# Patient Record
Sex: Male | Born: 1965 | Race: Black or African American | Hispanic: No | Marital: Married | Smoking: Current every day smoker
Health system: Southern US, Community
[De-identification: ages and names within clinical notes are randomized; demographics above are authoritative.]

---

## 2014-09-22 ENCOUNTER — Emergency Department (HOSPITAL_BASED_OUTPATIENT_CLINIC_OR_DEPARTMENT_OTHER)
Admission: EM | Admit: 2014-09-22 | Discharge: 2014-09-23 | Disposition: A | Discharge status: Home / Self Care | Payer: Self-pay | Attending: Emergency Medicine | Admitting: Emergency Medicine

## 2014-09-22 ENCOUNTER — Emergency Department (HOSPITAL_BASED_OUTPATIENT_CLINIC_OR_DEPARTMENT_OTHER): Payer: Self-pay

## 2014-09-22 ENCOUNTER — Encounter (HOSPITAL_BASED_OUTPATIENT_CLINIC_OR_DEPARTMENT_OTHER): Payer: Self-pay | Admitting: *Deleted

## 2014-09-22 DIAGNOSIS — R319 Hematuria, unspecified: Secondary | ICD-10-CM | POA: Insufficient documentation

## 2014-09-22 DIAGNOSIS — N3289 Other specified disorders of bladder: Secondary | ICD-10-CM

## 2014-09-22 DIAGNOSIS — R52 Pain, unspecified: Secondary | ICD-10-CM

## 2014-09-22 DIAGNOSIS — Z720 Tobacco use: Secondary | ICD-10-CM | POA: Insufficient documentation

## 2014-09-22 DIAGNOSIS — N329 Bladder disorder, unspecified: Secondary | ICD-10-CM | POA: Insufficient documentation

## 2014-09-22 DIAGNOSIS — R3129 Other microscopic hematuria: Secondary | ICD-10-CM

## 2014-09-22 DIAGNOSIS — K5909 Other constipation: Secondary | ICD-10-CM | POA: Insufficient documentation

## 2014-09-22 LAB — URINE MICROSCOPIC-ADD ON

## 2014-09-22 LAB — URINALYSIS, ROUTINE W REFLEX MICROSCOPIC
Bilirubin Urine: NEGATIVE
Glucose, UA: NEGATIVE mg/dL
Ketones, ur: 15 mg/dL — AB
Leukocytes, UA: NEGATIVE
NITRITE: NEGATIVE
PH: 5.5 (ref 5.0–8.0)
Protein, ur: NEGATIVE mg/dL
SPECIFIC GRAVITY, URINE: 1.027 (ref 1.005–1.030)
Urobilinogen, UA: 1 mg/dL (ref 0.0–1.0)

## 2014-09-22 MED ORDER — KETOROLAC TROMETHAMINE 30 MG/ML IJ SOLN
60.0000 mg | Freq: Once | INTRAMUSCULAR | Status: DC
  Filled 2014-09-22: qty 2

## 2014-09-22 MED ORDER — KETOROLAC TROMETHAMINE 60 MG/2ML IM SOLN
60.0000 mg | Freq: Once | INTRAMUSCULAR | Status: AC
  Administered 2014-09-22: 60 mg via INTRAMUSCULAR
  Filled 2014-09-22: qty 2

## 2014-09-22 NOTE — ED Provider Notes (Signed)
CSN: [id number]     Arrival date & time 09/22/14  2224 History   This chart was scribed for Avi Kerschner Smitty CordsK Yi Falletta-Rasch, MD by Freida Busmaniana Omoyeni, ED Scribe. This patient was seen in room MH12/MH12 and the patient's care was started 11:13 PM.  Chief Complaint  Patient presents with  . Abdominal Pain      Patient is a 48 y.o. male presenting with abdominal pain. The history is provided by the patient. No language interpreter was used.  Abdominal Pain Pain location:  Suprapubic Pain radiates to:  Does not radiate Pain severity:  Severe Onset quality:  Gradual Duration:  4 weeks Timing:  Intermittent Progression:  Resolved Chronicity:  Recurrent Context: not trauma   Relieved by:  Nothing Worsened by:  Nothing tried Ineffective treatments:  None tried Associated symptoms: no constipation, no fever, no nausea and no vomiting   Risk factors: no recent hospitalization    HPI Comments:  David Ruiz is a 48 y.o. male who presents to the Emergency Department complaining of intermittent lower abdominal pain that started about a month ago. He notes pain often resolves on its own but today's episode was longer than usual. He denies nausea, vomiting, constipation and fever. His last normal bowel movement was today. Pt notes he was advised by a doctor friend that he may have a hernia.  No alleviating factors noted.    History reviewed. No pertinent past medical history. History reviewed. No pertinent past surgical history. History reviewed. No pertinent family history. History  Substance Use Topics  . Smoking status: Current Every Day Smoker -- 0.50 packs/day    Types: Cigarettes  . Smokeless tobacco: Not on file  . Alcohol Use: No    Review of Systems  Constitutional: Negative for fever.  Gastrointestinal: Positive for abdominal pain. Negative for nausea, vomiting and constipation.  All other systems reviewed and are negative.     Allergies  Review of patient's allergies indicates no  known allergies.  Home Medications   Prior to Admission medications   Not on File   BP 161/100 mmHg  Pulse 89  Temp(Src) 98.6 F (37 C)  Resp 16  Ht 5\' 9"  (1.753 m)  Wt 160 lb (72.576 kg)  BMI 23.62 kg/m2  SpO2 100% Physical Exam  Constitutional: He is oriented to person, place, and time. He appears well-developed and well-nourished. No distress.  HENT:  Head: Normocephalic and atraumatic.  Mouth/Throat: Oropharynx is clear and moist. No oropharyngeal exudate.  Eyes: Conjunctivae are normal. Pupils are equal, round, and reactive to light.  Neck: Normal range of motion. Neck supple.  Cardiovascular: Normal rate, regular rhythm and normal heart sounds.   Pulmonary/Chest: Effort normal and breath sounds normal. No respiratory distress.  Abdominal: Soft. He exhibits no distension and no mass. There is no tenderness. There is no rebound and no guarding.  Hyperactive bowel sounds throughout. No hernia  Genitourinary:  No testicular hernia.  No defect in the abdomen wall.   No lymphadenopathy of the groin Chaperone  was present for exam which was performed with no discomfort or complications.    Musculoskeletal: Normal range of motion.  Neurological: He is alert and oriented to person, place, and time.  Skin: Skin is warm and dry.  Psychiatric: He has a normal mood and affect.  Nursing note and vitals reviewed.   ED Course  Procedures   DIAGNOSTIC STUDIES:  Oxygen Saturation is 100% on RA, normal by my interpretation.    COORDINATION OF CARE:  11:17 PM  Discussed treatment plan with pt at bedside and pt agreed to plan.  Labs Review Labs Reviewed  URINALYSIS, ROUTINE W REFLEX MICROSCOPIC    Imaging Review No results found.   EKG Interpretation None      MDM   Final diagnoses:  None  Patient pain free and sleeping in room upon reentrance for reevaluation  CT for stone given groin pain and microscopic hematuria.  No ureteral stones.  Constipation seen in the  sigmoid colon and and bladder thickening.  Patient has denied dysuria and urine is not infected.  Will refer to urology for evaluation of bladder wall thickening and microscopic hematuria.  Will treat constipation with miralax.  Patient informed of hematuria and bladder thickening and constipation and need for close follow up. Patient verbalizes understanding and agrees to follow up  I personally performed the services described in this documentation, which was scribed in my presence. The recorded information has been reviewed and is accurate.     Jasmine AweApril K Willena Jeancharles-Rasch, MD 09/23/14 [phone]

## 2014-09-22 NOTE — ED Notes (Signed)
Patient transported to CT 

## 2014-09-22 NOTE — ED Notes (Signed)
C/o left groin pain x 1 month off and on,  Was brought here by his md this pm to r/o hernia

## 2014-09-22 NOTE — ED Notes (Signed)
Pt c/o lower abd pain " on and off" x 1 month

## 2014-09-23 ENCOUNTER — Encounter (HOSPITAL_BASED_OUTPATIENT_CLINIC_OR_DEPARTMENT_OTHER): Payer: Self-pay | Admitting: Emergency Medicine

## 2014-09-23 MED ORDER — POLYETHYLENE GLYCOL 3350 17 GM/SCOOP PO POWD: 17.0000 g | Freq: Every day | ORAL | Status: AC

## 2016-06-15 IMAGING — CT CT RENAL STONE PROTOCOL
2 of 4 series · 15 of 46 positions shown, 17 images · non-contrast
Comparison: None.

CLINICAL DATA: Initial evaluation for left groin pain, hematuria.

EXAM:
CT ABDOMEN AND PELVIS WITHOUT CONTRAST
TECHNIQUE: Multidetector CT imaging of the abdomen and pelvis was performed
following the standard protocol without IV contrast.

[Series 2: renal stone < 200 lbs 5.0 b31f · axial · 0.66mm/px · z∈[-461,-36]mm · 12 of 93 slices shown, 14 images]
[im 4/93  soft-tissue]
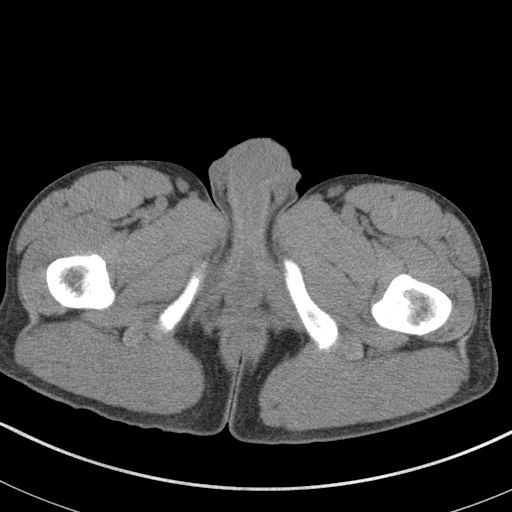
[im 4/93  bone]
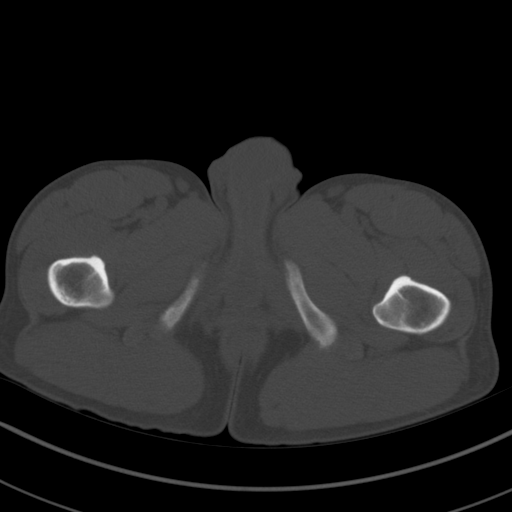
[im 12/93  soft-tissue]
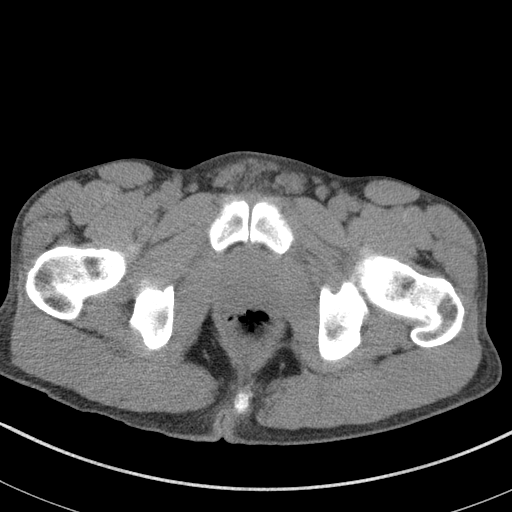
[im 20/93  soft-tissue]
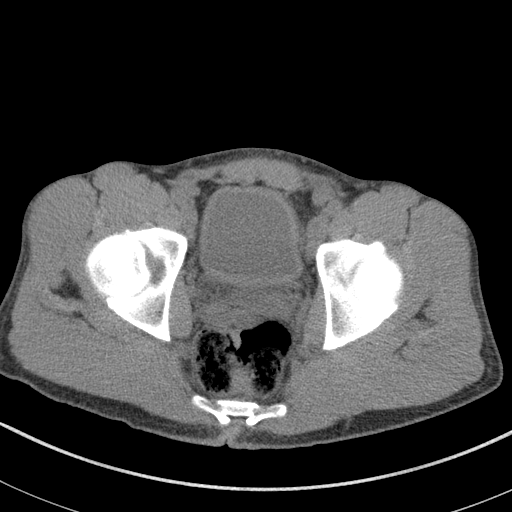
[im 27/93  soft-tissue]
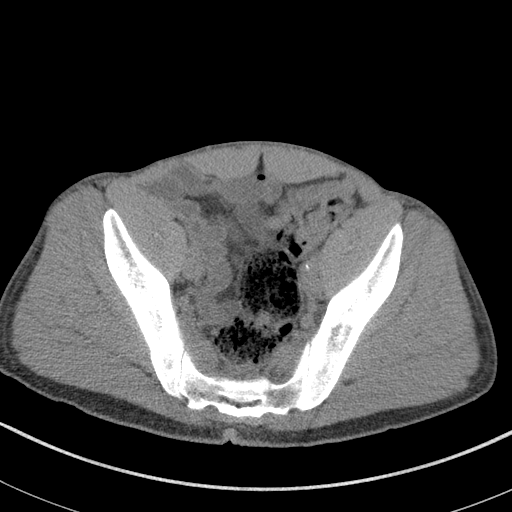
[im 35/93  soft-tissue]
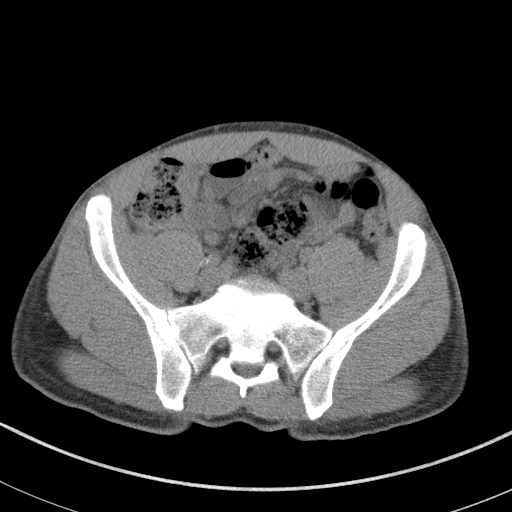
[im 43/93  soft-tissue]
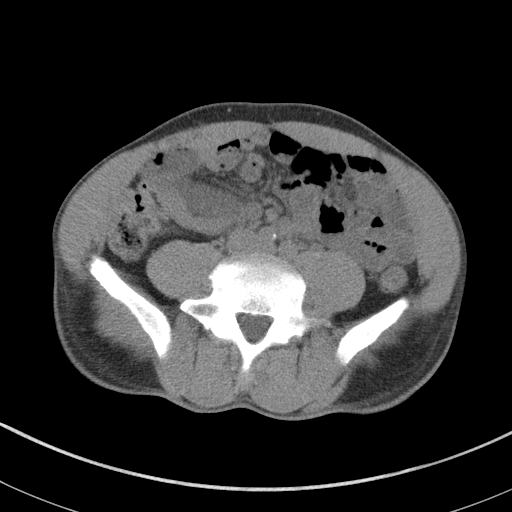
[im 50/93  soft-tissue]
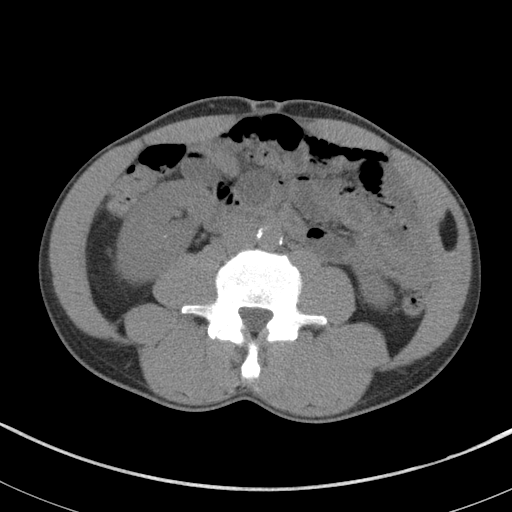
[im 58/93  soft-tissue]
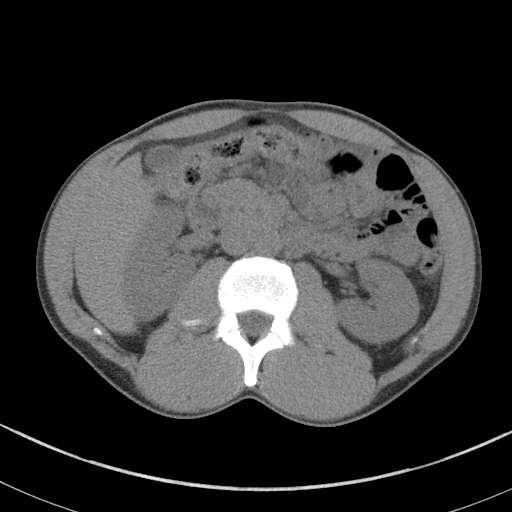
[im 66/93  soft-tissue]
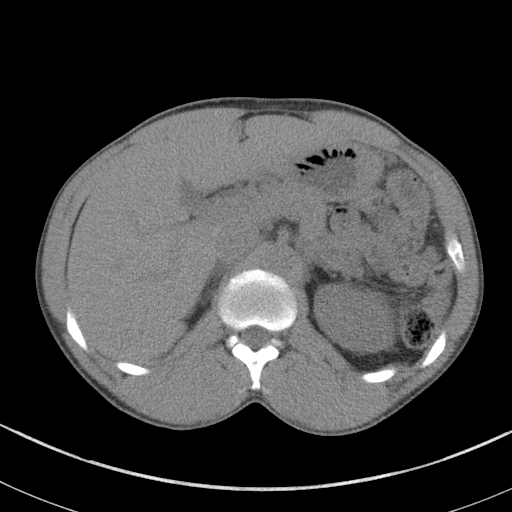
[im 66/93  bone]
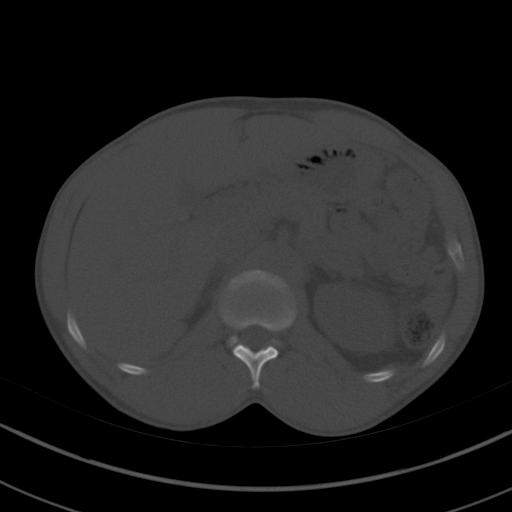
[im 73/93  soft-tissue]
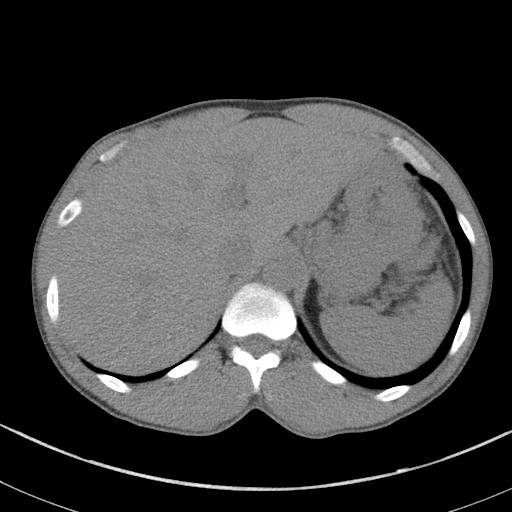
[im 81/93  soft-tissue]
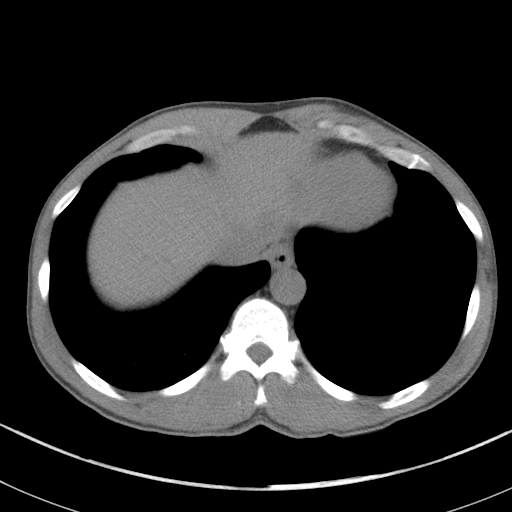
[im 89/93  soft-tissue]
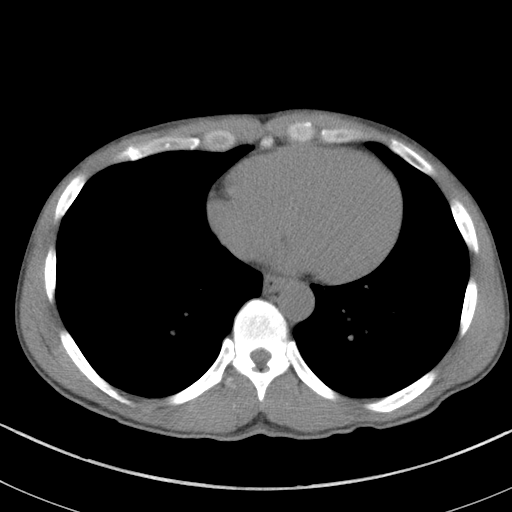

[Series 5: renal stone 3.0 coronal · coronal · 0.64mm/px · 3 of 76 slices shown]
[im 26/76  soft-tissue]
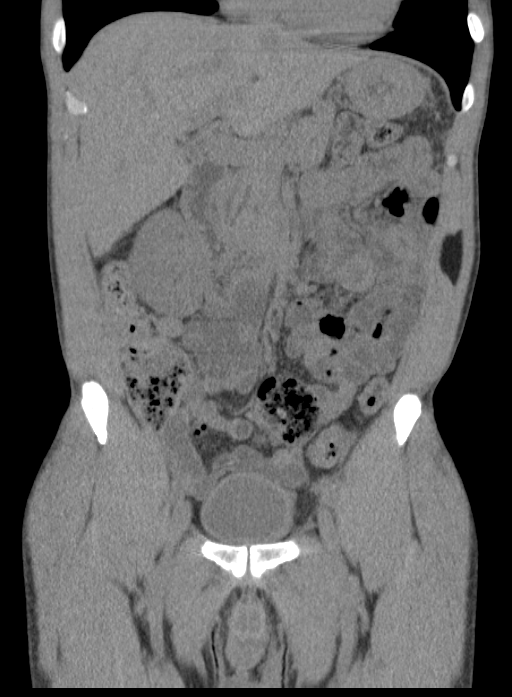
[im 34/76  soft-tissue]
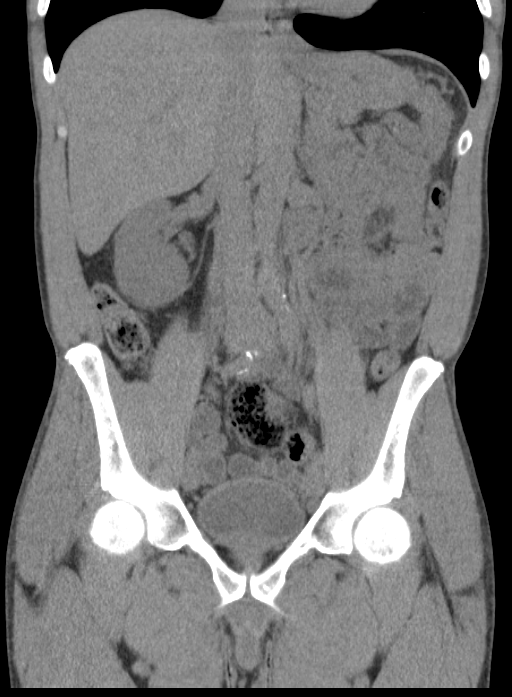
[im 42/76  soft-tissue]
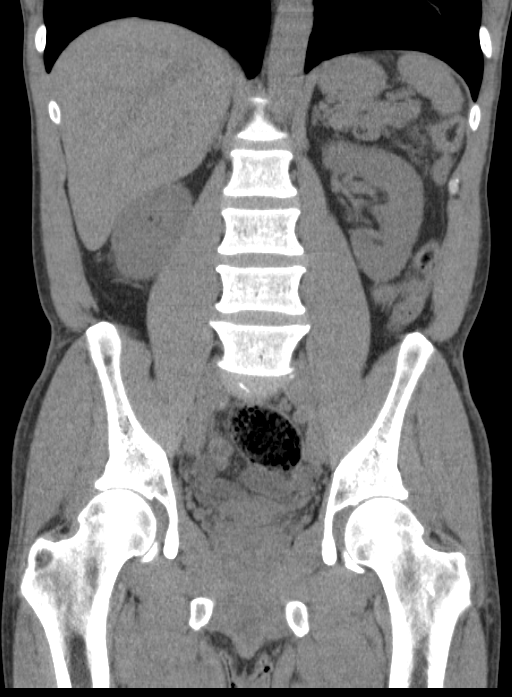

[15 of 46 positions shown; findings below may reference images not displayed]

FINDINGS: Mild subsegmental atelectasis seen dependently within the visualized
lung bases. No pleural or pericardial effusion.

The liver demonstrates a normal unenhanced appearance. Gallbladder
within normal limits. No biliary dilatation. The spleen, adrenal
glands, and pancreas demonstrate a normal unenhanced appearance.

3 mm nonobstructive stone present within the lower pole the right
kidney. No right-sided hydronephrosis or hydroureter. No stone seen
along the course of the right renal collecting system.

No left-sided nephrolithiasis identified. No hydronephrosis or
hydroureter on the left. No stone seen along the course of the left
renal collecting system.

Stomach within normal limits. No evidence for bowel obstruction.
Appendix within normal limits without evidence for acute
appendicitis. Moderate amount of retained stool present within the
rectosigmoid colon. No acute inflammatory changes seen about the
bowels.

Mild circumferential bladder wall thickening present, which may be
related to incomplete distension. Possible infection could also have
this appearance. Prostate within normal limits.

No free air or fluid. No pathologically enlarged intra-abdominal
pelvic lymph nodes. Mild calcified plaque present within the
infrarenal aorta and bilateral common iliac arteries. No aneurysm.

No acute osseous abnormality. No worrisome lytic or blastic osseous
lesions. Degenerative disc bulging present at L5-S1.
IMPRESSION: 1. No left-sided nephrolithiasis or obstructive uropathy to explain
left-sided groin pain.
2. Nonobstructive 3 mm right renal stone.
3. Mild circumferential bladder wall thickening. While this finding
may be related to incomplete distension, possible acute cystitis
could also have this appearance. Correlation with urinalysis
recommended.
4. Moderate amount of retained stool within the rectosigmoid colon,
suggesting constipation.
